# Patient Record
Sex: Male | Born: 1990 | Race: Black or African American | Hispanic: No | Marital: Single | Smoking: Never smoker
Health system: Southern US, Community
[De-identification: ages and names within clinical notes are randomized; demographics above are authoritative.]

---
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
## 2017-03-15 ENCOUNTER — Encounter (HOSPITAL_COMMUNITY): Payer: Self-pay | Admitting: Emergency Medicine

## 2017-03-15 ENCOUNTER — Ambulatory Visit (HOSPITAL_COMMUNITY)
Admission: EM | Admit: 2017-03-15 | Discharge: 2017-03-15 | Disposition: A | Discharge status: Home / Self Care | Payer: Self-pay | Attending: Emergency Medicine | Admitting: Emergency Medicine

## 2017-03-15 ENCOUNTER — Other Ambulatory Visit: Payer: Self-pay

## 2017-03-15 DIAGNOSIS — J069 Acute upper respiratory infection, unspecified: Secondary | ICD-10-CM

## 2017-03-15 MED ORDER — PSEUDOEPH-BROMPHEN-DM 30-2-10 MG/5ML PO SYRP: 5.0000 mL | ORAL_SOLUTION | Freq: Four times a day (QID) | ORAL | 0 refills | Status: AC | PRN

## 2017-03-15 NOTE — Discharge Instructions (Signed)
Please take medications as prescribed.  Alternate Tylenol and ibuprofen as needed for chills and body aches.  Please make sure you are drinking lots of fluids and return to the emergency department for any shortness of breath, fevers 1-2.1, worsening symptoms or any changes in health.

## 2017-03-15 NOTE — ED Provider Notes (Signed)
[facility]    CSN: [id number] Arrival date & time: 03/15/17  1539     History   Chief Complaint Chief Complaint  Patient presents with  . URI    HPI Julian Wallace is a 27 y.o. male presents to the urgent care facility for evaluation of sore throat, cough, chills.  Symptoms began 3 days ago.  Girlfriend recently recovered from cold symptoms.  He last took ibuprofen yesterday.  He has not been taking any other medications.  Patient denies any nausea, vomiting, diarrhea, fevers, abdominal pain.  He is tolerating p.o. well.  HPI  History reviewed. No pertinent past medical history.  There are no active problems to display for this patient.   History reviewed. No pertinent surgical history.     Home Medications    Prior to Admission medications   Medication Sig Start Date End Date Taking? Authorizing Provider  brompheniramine-pseudoephedrine-DM 30-2-10 MG/5ML syrup Take 5 mLs by mouth 4 (four) times daily as needed. 03/15/17   Evon SlackGaines, Thomas C, PA-C    Family History Family History  Problem Relation Age of Onset  . Diabetes Mother   . Hypertension Mother     Social History Social History   Tobacco Use  . Smoking status: Never Smoker  Substance Use Topics  . Alcohol use: Yes  . Drug use: No     Allergies   Patient has no known allergies.   Review of Systems Review of Systems  Constitutional: Negative for fever.  HENT: Positive for congestion, rhinorrhea and sore throat. Negative for sinus pressure and sinus pain.   Respiratory: Positive for cough. Negative for wheezing and stridor.   Gastrointestinal: Negative for diarrhea, nausea and vomiting.  Musculoskeletal: Positive for myalgias. Negative for arthralgias and neck stiffness.  Skin: Negative for rash.  Neurological: Negative for dizziness.     Physical Exam Triage Vital Signs ED Triage Vitals  Enc Vitals Group     BP 03/15/17 1803 124/65     Pulse Rate 03/15/17 1803 72     Resp  03/15/17 1803 18     Temp 03/15/17 1803 98.1 F (36.7 C)     Temp Source 03/15/17 1803 Oral     SpO2 03/15/17 1803 100 %     Weight --      Height --      Head Circumference --      Peak Flow --      Pain Score 03/15/17 1800 4     Pain Loc --      Pain Edu? --      Excl. in GC? --    No data found.  Updated Vital Signs BP 124/65 (BP Location: Right Arm)   Pulse 72   Temp 98.1 F (36.7 C) (Oral)   Resp 18   SpO2 100%   Visual Acuity Right Eye Distance:   Left Eye Distance:   Bilateral Distance:    Right Eye Near:   Left Eye Near:    Bilateral Near:     Physical Exam  Constitutional: He is oriented to person, place, and time. He appears well-developed and well-nourished. No distress.  HENT:  Head: Normocephalic and atraumatic.  Right Ear: Hearing, tympanic membrane, external ear and ear canal normal.  Left Ear: Hearing, tympanic membrane, external ear and ear canal normal.  Nose: Rhinorrhea present. No nasal septal hematoma. Right sinus exhibits no maxillary sinus tenderness and no frontal sinus tenderness. Left sinus exhibits no maxillary sinus tenderness and no frontal  sinus tenderness.  Mouth/Throat: No trismus in the jaw. No uvula swelling. No oropharyngeal exudate, posterior oropharyngeal edema, posterior oropharyngeal erythema or tonsillar abscesses.  Eyes: Conjunctivae are normal.  Neck: Normal range of motion.  Cardiovascular: Normal rate and regular rhythm.  Pulmonary/Chest: No stridor. No respiratory distress. He has no wheezes. He exhibits no tenderness.  Abdominal: Soft. He exhibits no distension. There is no tenderness. There is no guarding.  Musculoskeletal: Normal range of motion.  Neurological: He is alert and oriented to person, place, and time.  Skin: Skin is warm and dry. No rash noted.  Psychiatric: He has a normal mood and affect. His behavior is normal. Judgment and thought content normal.     UC Treatments / Results  Labs (all labs ordered  are listed, but only abnormal results are displayed) Labs Reviewed - No data to display  EKG  EKG Interpretation None       Radiology No results found.  Procedures Procedures (including critical care time)  Medications Ordered in UC Medications - No data to display   Initial Impression / Assessment and Plan / UC Course  I have reviewed the triage vital signs and the nursing notes.  Pertinent labs & imaging results that were available during my care of the patient were reviewed by me and considered in my medical decision making (see chart for details).     27 year old male with upper respiratory infection.  Vital signs are stable.  Patient tolerating p.o. well.  Patient will alternate Tylenol and ibuprofen.  He is given a prescription for Bromfed to help treat symptoms.  He is educated on signs and symptoms return to clinic for. Final Clinical Impressions(s) / UC Diagnoses   Final diagnoses:  Viral upper respiratory tract infection    ED Discharge Orders        Ordered    brompheniramine-pseudoephedrine-DM 30-2-10 MG/5ML syrup  4 times daily PRN     03/15/17 1809        Evon Slack, PA-C 03/15/17 1824

## 2017-03-15 NOTE — ED Triage Notes (Signed)
Onset of symptoms was Friday, woke Saturday with sore throat.  Patient reports runny nose, sore throat, cough, hot, sweaty episodes.

## 2020-06-23 ENCOUNTER — Emergency Department (HOSPITAL_COMMUNITY)
Admission: EM | Admit: 2020-06-23 | Discharge: 2020-06-24 | Disposition: A | Discharge status: Home / Self Care | Payer: 59 | Attending: Emergency Medicine | Admitting: Emergency Medicine

## 2020-06-23 ENCOUNTER — Other Ambulatory Visit: Payer: Self-pay

## 2020-06-23 DIAGNOSIS — Z5321 Procedure and treatment not carried out due to patient leaving prior to being seen by health care provider: Secondary | ICD-10-CM

## 2020-06-23 DIAGNOSIS — R202 Paresthesia of skin: Secondary | ICD-10-CM | POA: Insufficient documentation

## 2020-06-23 LAB — CBC WITH DIFFERENTIAL/PLATELET
Abs Immature Granulocytes: 0.01 10*3/uL (ref 0.00–0.07)
Basophils Absolute: 0 10*3/uL (ref 0.0–0.1)
Basophils Relative: 0 %
Eosinophils Absolute: 0.3 10*3/uL (ref 0.0–0.5)
Eosinophils Relative: 4 %
HCT: 42.8 % (ref 39.0–52.0)
Hemoglobin: 13.5 g/dL (ref 13.0–17.0)
Immature Granulocytes: 0 %
Lymphocytes Relative: 43 %
Lymphs Abs: 2.9 10*3/uL (ref 0.7–4.0)
MCH: 28.9 pg (ref 26.0–34.0)
MCHC: 31.5 g/dL (ref 30.0–36.0)
MCV: 91.6 fL (ref 80.0–100.0)
Monocytes Absolute: 0.6 10*3/uL (ref 0.1–1.0)
Monocytes Relative: 9 %
Neutro Abs: 3 10*3/uL (ref 1.7–7.7)
Neutrophils Relative %: 44 %
Platelets: 225 10*3/uL (ref 150–400)
RBC: 4.67 MIL/uL (ref 4.22–5.81)
RDW: 12.8 % (ref 11.5–15.5)
WBC: 6.8 10*3/uL (ref 4.0–10.5)
nRBC: 0 % (ref 0.0–0.2)

## 2020-06-23 LAB — COMPREHENSIVE METABOLIC PANEL
ALT: 14 U/L (ref 0–44)
AST: 22 U/L (ref 15–41)
Albumin: 4.3 g/dL (ref 3.5–5.0)
Alkaline Phosphatase: 64 U/L (ref 38–126)
Anion gap: 5 (ref 5–15)
BUN: 12 mg/dL (ref 6–20)
CO2: 30 mmol/L (ref 22–32)
Calcium: 9.3 mg/dL (ref 8.9–10.3)
Chloride: 103 mmol/L (ref 98–111)
Creatinine, Ser: 1.12 mg/dL (ref 0.61–1.24)
GFR, Estimated: >60 mL/min (ref >60)
Glucose, Bld: 108 mg/dL — ABNORMAL HIGH (ref 70–99)
Potassium: 3.5 mmol/L (ref 3.5–5.1)
Sodium: 138 mmol/L (ref 135–145)
Total Bilirubin: 0.9 mg/dL (ref 0.3–1.2)
Total Protein: 7.7 g/dL (ref 6.5–8.1)

## 2020-06-23 LAB — PHOSPHORUS: Phosphorus: 3.7 mg/dL (ref 2.5–4.6)

## 2020-06-23 LAB — MAGNESIUM: Magnesium: 2 mg/dL (ref 1.7–2.4)

## 2020-06-23 NOTE — ED Triage Notes (Signed)
Pt c/o L sided foot numbness x 4 days that has progressed to R foot and then R leg. States it now feels numb/tingling on whole R side. Denies visual changes or weakness

## 2020-06-23 NOTE — ED Provider Notes (Signed)
Emergency Medicine Provider Triage Evaluation Note  Julian Wallace , a 30 y.o. male  was evaluated in triage.  Pt complains of numbness.  Review of Systems  Positive: Tingling sensation, numbness Negative: Fever, headache, diplopia, pain, injury, rash  Physical Exam  BP (!) 157/94 (BP Location: Right Arm)   Pulse 85   Temp 98.9 F (37.2 C) (Oral)   Resp 18   Ht 6\' 1"  (1.854 m)   Wt 77.1 kg   SpO2 95%   BMI 22.43 kg/m  Gen:   Awake, no distress   Resp:  Normal effort  MSK:   Moves extremities without difficulty  Other:  Mild decreased sensation to R side of body compare to left.  Normal gait, normal patella DTR bilaterally.  No footdrops  Medical Decision Making  Medically screening exam initiated at 9:22 PM.  Appropriate orders placed.  Gasser was informed that the remainder of the evaluation will be completed by another provider, this initial triage assessment does not replace that evaluation, and the importance of remaining in the ED until their evaluation is complete.  Pt report tingling and numbness sensation to L foot 3-4 days ago, and for the past 2-3 days he experienced numbness involving R side of body from chest down to R foot, with tingling sensation on L foot.  No hx of DM, no recent viral infection, no difficulty breathing.  Does do heavy lifting at work.     Isaac Laud, PA-C 06/23/20 2124    2125, MD 06/25/20 [phone]

## 2020-06-24 ENCOUNTER — Other Ambulatory Visit: Payer: Self-pay

## 2020-06-24 ENCOUNTER — Encounter (HOSPITAL_COMMUNITY): Payer: Self-pay

## 2020-06-24 ENCOUNTER — Emergency Department (HOSPITAL_COMMUNITY)
Admission: EM | Admit: 2020-06-24 | Discharge: 2020-06-24 | Disposition: A | Discharge status: Home / Self Care | Payer: 59 | Source: Home / Self Care | Attending: Emergency Medicine | Admitting: Emergency Medicine

## 2020-06-24 ENCOUNTER — Emergency Department (HOSPITAL_COMMUNITY): Payer: 59

## 2020-06-24 DIAGNOSIS — R200 Anesthesia of skin: Secondary | ICD-10-CM

## 2020-06-24 DIAGNOSIS — R202 Paresthesia of skin: Secondary | ICD-10-CM | POA: Insufficient documentation

## 2020-06-24 LAB — CBC WITH DIFFERENTIAL/PLATELET
Abs Immature Granulocytes: 0.02 10*3/uL (ref 0.00–0.07)
Basophils Absolute: 0 10*3/uL (ref 0.0–0.1)
Basophils Relative: 1 %
Eosinophils Absolute: 0.2 10*3/uL (ref 0.0–0.5)
Eosinophils Relative: 3 %
HCT: 45.5 % (ref 39.0–52.0)
Hemoglobin: 14.5 g/dL (ref 13.0–17.0)
Immature Granulocytes: 0 %
Lymphocytes Relative: 26 %
Lymphs Abs: 1.6 10*3/uL (ref 0.7–4.0)
MCH: 29.6 pg (ref 26.0–34.0)
MCHC: 31.9 g/dL (ref 30.0–36.0)
MCV: 92.9 fL (ref 80.0–100.0)
Monocytes Absolute: 0.5 10*3/uL (ref 0.1–1.0)
Monocytes Relative: 8 %
Neutro Abs: 3.9 10*3/uL (ref 1.7–7.7)
Neutrophils Relative %: 62 %
Platelets: 209 10*3/uL (ref 150–400)
RBC: 4.9 MIL/uL (ref 4.22–5.81)
RDW: 12.7 % (ref 11.5–15.5)
WBC: 6.2 10*3/uL (ref 4.0–10.5)
nRBC: 0 % (ref 0.0–0.2)

## 2020-06-24 LAB — COMPREHENSIVE METABOLIC PANEL
ALT: 15 U/L (ref 0–44)
AST: 19 U/L (ref 15–41)
Albumin: 4.6 g/dL (ref 3.5–5.0)
Alkaline Phosphatase: 63 U/L (ref 38–126)
Anion gap: 6 (ref 5–15)
BUN: 12 mg/dL (ref 6–20)
CO2: 29 mmol/L (ref 22–32)
Calcium: 9.8 mg/dL (ref 8.9–10.3)
Chloride: 104 mmol/L (ref 98–111)
Creatinine, Ser: 1.15 mg/dL (ref 0.61–1.24)
GFR, Estimated: >60 mL/min (ref >60)
Glucose, Bld: 84 mg/dL (ref 70–99)
Potassium: 4.1 mmol/L (ref 3.5–5.1)
Sodium: 139 mmol/L (ref 135–145)
Total Bilirubin: 1.3 mg/dL — ABNORMAL HIGH (ref 0.3–1.2)
Total Protein: 7.9 g/dL (ref 6.5–8.1)

## 2020-06-24 LAB — RAPID URINE DRUG SCREEN, HOSP PERFORMED
Amphetamines: NOT DETECTED
Barbiturates: NOT DETECTED
Benzodiazepines: NOT DETECTED
Cocaine: NOT DETECTED
Opiates: NOT DETECTED
Tetrahydrocannabinol: POSITIVE — AB

## 2020-06-24 NOTE — ED Triage Notes (Signed)
Patient c/o numbness and tingling x 5 day to the left foot. Patient also c/o right foot and right legs pain x 4 days. Patient now says the right torso is numb and that began yesterday.  Patient denies any visual changes or weakness. Patient was seen yesterday for the same and left prior to knowing the results of labs, etc.

## 2020-06-24 NOTE — ED Provider Notes (Signed)
[facility]-EMERGENCY DEPT Provider Note   CSN: [id number] Arrival date & time: 06/24/20  1435     History No chief complaint on file.   Julian Wallace is a 30 y.o. male.  HPI 30 year old male with no significant medical history presents to the ER with complaints of numbness and tingling.  Patient states that initially he started having numbness and tingling to his left leg about a week ago, and this is gradually progressed over to his right, going up his right thigh.  Denies any weakness in his extremities he was seen last night in the ER but he left without being seen with similar complaint.  States today he started having right-sided rib numbness as well.  He denies loss of bowel bladder control.  No fevers or chills.  No recent viral illnesses.  No history of IV drug use.  He denies any foot drop or difficulty walking.  No dizziness.  No blurred vision.  No facial droop or word slurring.  He has not taken anything for his symptoms.  He reports history of chronic back pain, but no recent falls or injuries.    History reviewed. No pertinent past medical history.  There are no problems to display for this patient.   History reviewed. No pertinent surgical history.     Family History  Problem Relation Age of Onset  . Diabetes Mother   . Hypertension Mother     Social History   Tobacco Use  . Smoking status: Never Smoker  . Smokeless tobacco: Never Used  Vaping Use  . Vaping Use: Never used  Substance Use Topics  . Alcohol use: Yes  . Drug use: No    Home Medications Prior to Admission medications   Medication Sig Start Date End Date Taking? Authorizing Provider  brompheniramine-pseudoephedrine-DM 30-2-10 MG/5ML syrup Take 5 mLs by mouth 4 (four) times daily as needed. 03/15/17   Evon Slack, PA-C    Allergies    Patient has no known allergies.  Review of Systems   Review of Systems  Constitutional: Negative for chills and fever.  HENT:  Negative for ear pain and sore throat.   Eyes: Negative for pain and visual disturbance.  Respiratory: Negative for cough and shortness of breath.   Cardiovascular: Negative for chest pain and palpitations.  Gastrointestinal: Negative for abdominal pain and vomiting.  Genitourinary: Negative for dysuria and hematuria.  Musculoskeletal: Negative for arthralgias and back pain.  Skin: Negative for color change and rash.  Neurological: Positive for numbness. Negative for dizziness, seizures, syncope, facial asymmetry and weakness.  All other systems reviewed and are negative.   Physical Exam Updated Vital Signs BP 128/86   Pulse 72   Temp 98.6 F (37 C) (Oral)   Resp 18   Ht 6\' 1"  (1.854 m)   Wt 77.1 kg   SpO2 98%   BMI 22.43 kg/m   Physical Exam Vitals and nursing note reviewed.  Constitutional:      General: He is not in acute distress.    Appearance: He is well-developed. He is not ill-appearing, toxic-appearing or diaphoretic.  HENT:     Head: Normocephalic and atraumatic.  Eyes:     Conjunctiva/sclera: Conjunctivae normal.  Cardiovascular:     Rate and Rhythm: Normal rate and regular rhythm.     Heart sounds: No murmur heard.   Pulmonary:     Effort: Pulmonary effort is normal. No respiratory distress.     Breath sounds: Normal breath sounds.  Abdominal:     Palpations: Abdomen is soft.     Tenderness: There is no abdominal tenderness.  Musculoskeletal:        General: No tenderness. Normal range of motion.     Cervical back: Neck supple.     Right lower leg: No edema.     Left lower leg: No edema.     Comments: No C, T, L-spine tenderness.  5/5 strength in upper and lower extremities.  No noticeable step-offs, crepitus, fluctuance, erythema.  Sensations intact.  Full range of motion and strength of neck. Moving all 4 extremities without difficulty.     Skin:    General: Skin is warm and dry.     Findings: No erythema.  Neurological:     General: No focal  deficit present.     Mental Status: He is alert and oriented to person, place, and time.     Sensory: No sensory deficit.     Motor: No weakness.     Comments: 5/5 strength in upper and lower extremities bilaterally.  Full range of motion of all 4 extremities.  Patient does respond to light touch as well as pinching in all 4 extremities and chest wall.  2+ patellar and Achilles reflexes, as well as 2+ brachioradialis and biceps reflex.Mental Status:  Alert, thought content appropriate, able to give a coherent history. Speech fluent without evidence of aphasia. Able to follow 2 step commands without difficulty.  Cranial Nerves:  II: Peripheral visual fields grossly normal, pupils equal, round, reactive to light III,IV, VI: ptosis not present, extra-ocular motions intact bilaterally  V,VII: smile symmetric, facial light touch sensation equal VIII: hearing grossly normal to voice  X: uvula elevates symmetrically  XI: bilateral shoulder shrug symmetric and strong XII: midline tongue extension without fassiculations Motor:  Normal tone. 5/5 strength of BUE and BLE major muscle groups including strong and equal grip strength and dorsiflexion/plantar flexion Sensory: light touch normal in all extremities. Cerebellar: normal finger-to-nose with bilateral upper extremities, Romberg sign absent Gait: normal gait and balance. Able to walk on toes and heels with ease.  No foot drop.   Psychiatric:        Mood and Affect: Mood normal.        Behavior: Behavior normal.     ED Results / Procedures / Treatments   Labs (all labs ordered are listed, but only abnormal results are displayed) Labs Reviewed  COMPREHENSIVE METABOLIC PANEL - Abnormal; Notable for the following components:      Result Value   Total Bilirubin 1.3 (*)    All other components within normal limits  CBC WITH DIFFERENTIAL/PLATELET    EKG None  Radiology CT Head Wo Contrast  Result Date: 06/24/2020 CLINICAL DATA:   Bilateral leg tingling and numbness x5 days. EXAM: CT HEAD WITHOUT CONTRAST TECHNIQUE: Contiguous axial images were obtained from the base of the skull through the vertex without intravenous contrast. COMPARISON:  None. FINDINGS: Brain: No evidence of acute infarction, hemorrhage, hydrocephalus, extra-axial collection or mass lesion/mass effect. Vascular: No hyperdense vessel or unexpected calcification. Skull: Normal. Negative for fracture or focal lesion. Sinuses/Orbits: Mild left ethmoid sinus and right-sided frontal sinus mucosal thickening is noted. Other: A subcentimeter right frontal scalp soft tissue calcification is seen. IMPRESSION: No acute intracranial abnormality. Electronically Signed   By: Aram Candela M.D.   On: 06/24/2020 16:38    Procedures Procedures   Medications Ordered in ED Medications - No data to display  ED Course  I have  reviewed the triage vital signs and the nursing notes.  Pertinent labs & imaging results that were available during my care of the patient were reviewed by me and considered in my medical decision making (see chart for details).    MDM Rules/Calculators/A&P                          30 year old male who presents to the ER with complaints of "numbness" to his right and left lower extremities, which is now progressed to the right side of his rib cage.  On arrival, he is well-appearing, no acute distress, resting comfortably in the ER bed.  In-depth physical exam overall very reassuring, he has no weakness in upper or lower extremities bilaterally, his reflexes are intact, he does endorse sensation light touch and pinching in his upper and lower extremities bilaterally.  He ambulated with in front of a with no evidence of foot drop.  He has no midline tenderness to the C, T, L-spine.  He has no focal neurologic deficits including facial droop, word slurring, cranial nerves are intact  Labs and imaging ordered, reviewed and interpreted by me.  CBC and  CMP unremarkable, no evidence of electrode abnormalities, no white count.  CT of the head reassuring.  Unclear source of the patient's symptoms, however my suspicion for Guillain-Barr is low at this time given reassuring neurologic exam.  No evidence of cauda equina.  Low suspicion fo spinal abscess given no fever, no risk factors, no white count.  No evidence of stroke.  Suspicion for MS this time.  Will refer to neurology.  Patient was informed of the reassuring findings.  We discussed neurology follow-up, he voiced understanding and is agreeable.  We discussed return precautions.  Stable for discharge  Case discussed with Dr. Silverio Lay who is agreeable to the above plan and disposition Final Clinical Impression(s) / ED Diagnoses Final diagnoses:  Numbness    Rx / DC Orders ED Discharge Orders    None       Leone Brand 06/24/20 1813    Charlynne Pander, MD 06/24/20 2330

## 2020-06-24 NOTE — ED Notes (Signed)
An After Visit Summary was printed and given to the patient. Discharge instructions given and no further questions at this time.  

## 2020-06-24 NOTE — ED Provider Notes (Signed)
Went to evaluate patient and has apparently eloped without full evaluation.    Marily Memos, MD 06/24/20 0157

## 2020-06-24 NOTE — Discharge Instructions (Signed)
Your work-up today was overall reassuring.  Our suspicion for any acute life-threatening cause of your symptoms is low at this time.  Your scan of your head and your lab work did not show any acute abnormalities.  Please make sure to follow-up with neurology for further evaluation of your symptoms.  Please return to the ER for any new or worsening symptoms peer

## 2020-06-24 NOTE — ED Provider Notes (Incomplete)
  [facility]-EMERGENCY DEPT Provider Note   CSN: [id number] Arrival date & time: 06/24/20  1435     History No chief complaint on file.   Julian Wallace is a 30 y.o. male.  HPI     History reviewed. No pertinent past medical history.  There are no problems to display for this patient.   History reviewed. No pertinent surgical history.     Family History  Problem Relation Age of Onset  . Diabetes Mother   . Hypertension Mother     Social History   Tobacco Use  . Smoking status: Never Smoker  . Smokeless tobacco: Never Used  Vaping Use  . Vaping Use: Never used  Substance Use Topics  . Alcohol use: Yes  . Drug use: No    Home Medications Prior to Admission medications   Medication Sig Start Date End Date Taking? Authorizing Provider  brompheniramine-pseudoephedrine-DM 30-2-10 MG/5ML syrup Take 5 mLs by mouth 4 (four) times daily as needed. 03/15/17   Evon Slack, PA-C    Allergies    Patient has no known allergies.  Review of Systems   Review of Systems  Physical Exam Updated Vital Signs BP 118/77 (BP Location: Left Arm)   Pulse 68   Temp 98.6 F (37 C) (Oral)   Resp 16   Ht 6\' 1"  (1.854 m)   Wt 77.1 kg   SpO2 97%   BMI 22.43 kg/m   Physical Exam  ED Results / Procedures / Treatments   Labs (all labs ordered are listed, but only abnormal results are displayed) Labs Reviewed  CBC WITH DIFFERENTIAL/PLATELET  COMPREHENSIVE METABOLIC PANEL    EKG None  Radiology No results found.  Procedures Procedures {Remember to document critical care time when appropriate:1}  Medications Ordered in ED Medications - No data to display  ED Course  I have reviewed the triage vital signs and the nursing notes.  Pertinent labs & imaging results that were available during my care of the patient were reviewed by me and considered in my medical decision making (see chart for details).    MDM Rules/Calculators/A&P                           *** Final Clinical Impression(s) / ED Diagnoses Final diagnoses:  None    Rx / DC Orders ED Discharge Orders    None

## 2020-06-24 NOTE — ED Notes (Addendum)
Pt was not in designated hallway bed when staff returned to area. Pt not located in department. Pt did not inform staff he was leaving. ED provider notified.

## 2020-09-09 ENCOUNTER — Telehealth: Payer: Self-pay | Admitting: Diagnostic Neuroimaging

## 2020-09-09 NOTE — Telephone Encounter (Signed)
I LVM for patient to call us back. We will be closed on Friday and we need to reschedule him. Dr. Epimenio Foot has multiple times available on Thursday 8/4 that the patient can be put in. This this doesn't work for him referrals can find a new appointment for him.

## 2020-09-12 ENCOUNTER — Ambulatory Visit: Payer: 59 | Admitting: Diagnostic Neuroimaging

## 2022-07-19 IMAGING — CT CT HEAD W/O CM
4 series · 17 of 47 positions shown, 19 images · non-contrast
Comparison: None.

CLINICAL DATA: Bilateral leg tingling and numbness x5 days.

EXAM:
CT HEAD WITHOUT CONTRAST
TECHNIQUE: Contiguous axial images were obtained from the base of the skull
through the vertex without intravenous contrast.

[Series 2: head wo · axial · 0.43mm/px · z∈[-137,-17]mm · 7 of 32 slices shown, 9 images]
[im 4/32  brain]
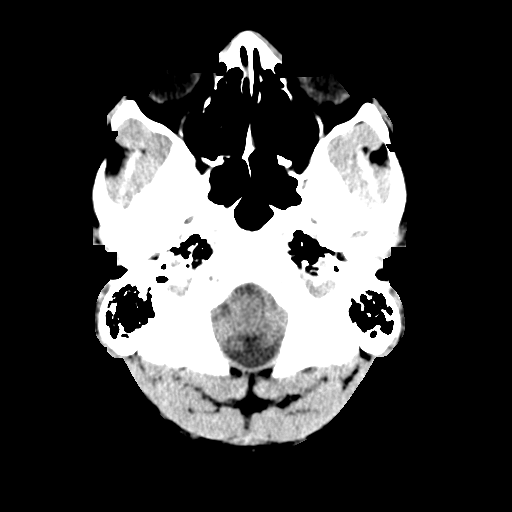
[im 4/32  bone]
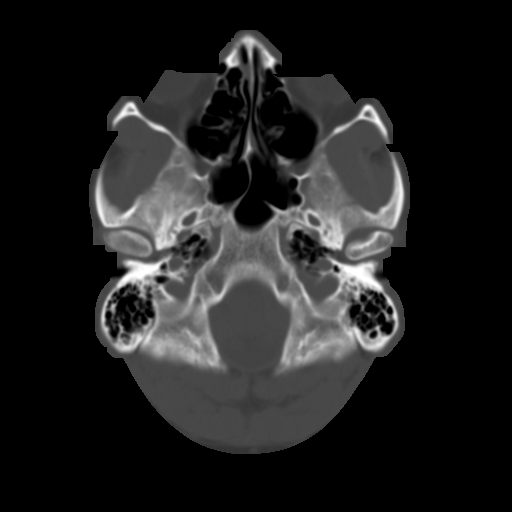
[im 8/32  brain]
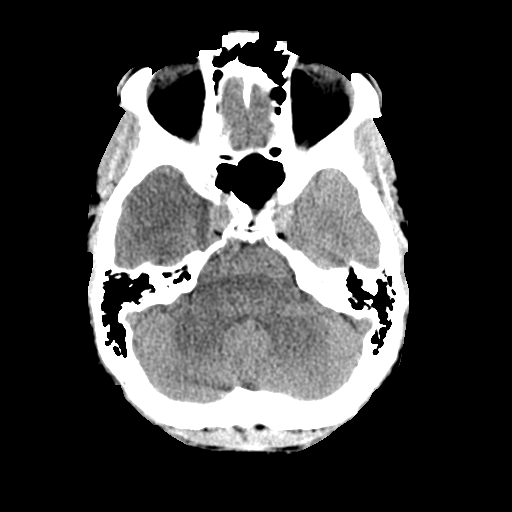
[im 12/32  brain]
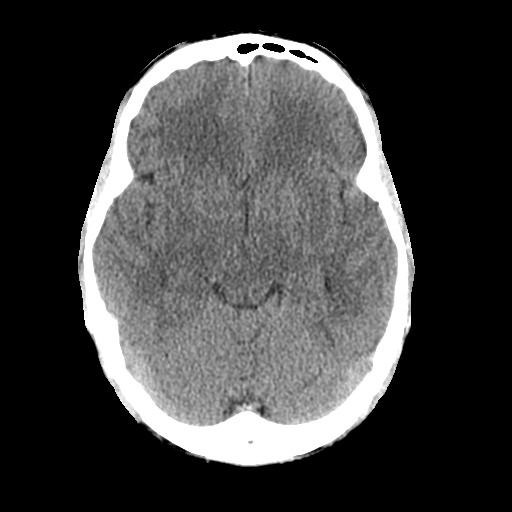
[im 16/32  brain]
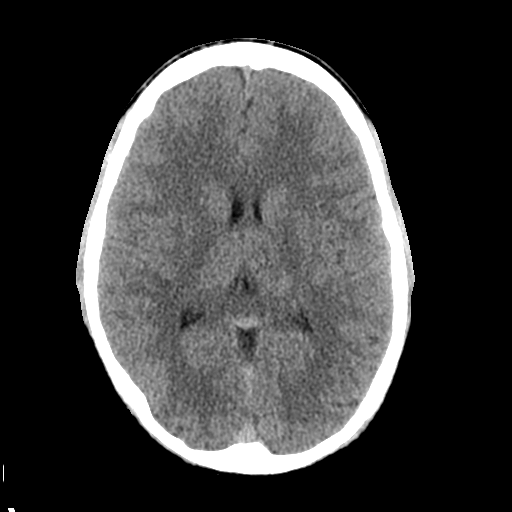
[im 20/32  brain]
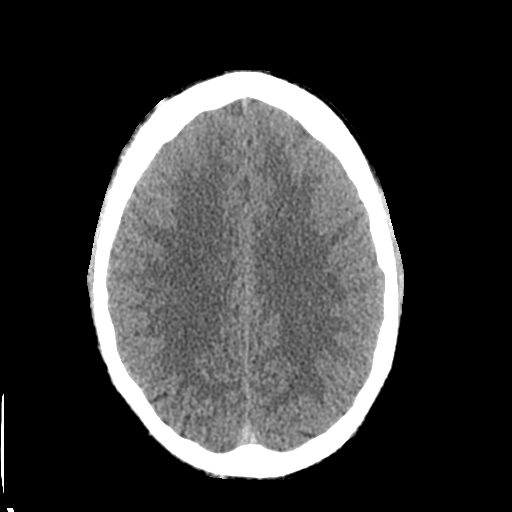
[im 20/32  bone]
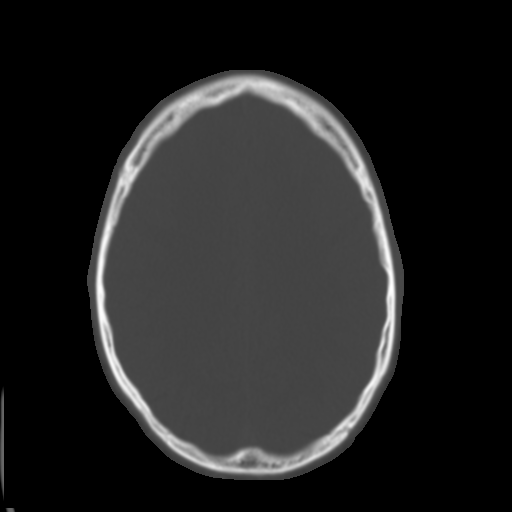
[im 24/32  brain]
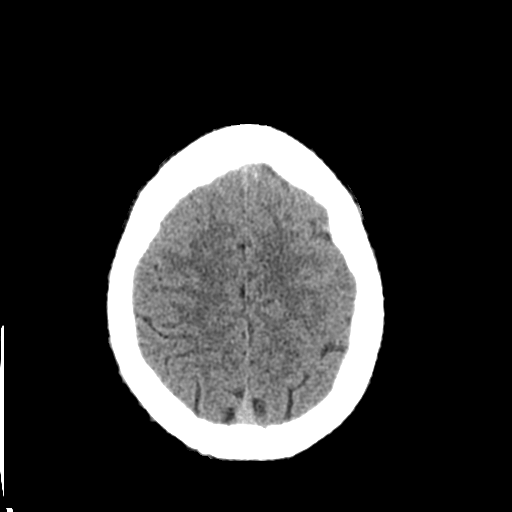
[im 28/32  brain]
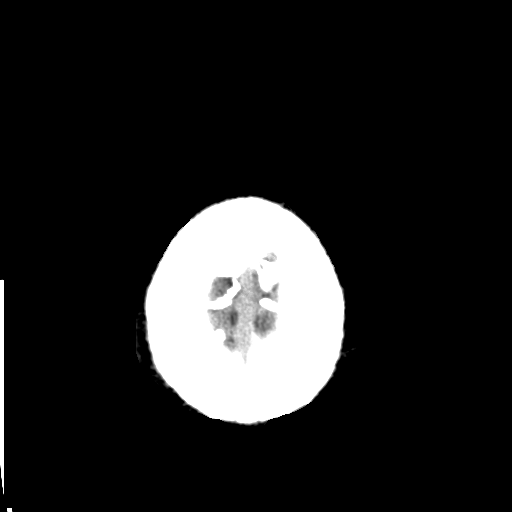

[Series 3: head bone · axial · 0.43mm/px · z∈[-138,-82]mm · 4 of 80 slices shown]
[im 8/80  bone]
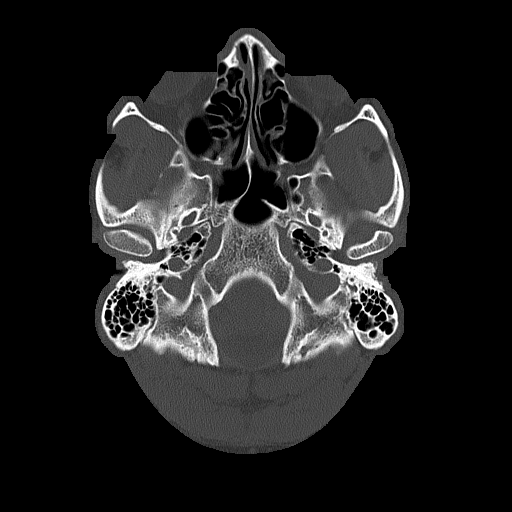
[im 16/80  bone]
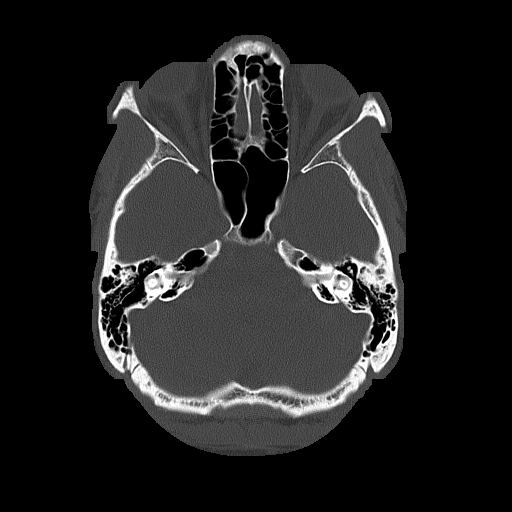
[im 24/80  bone]
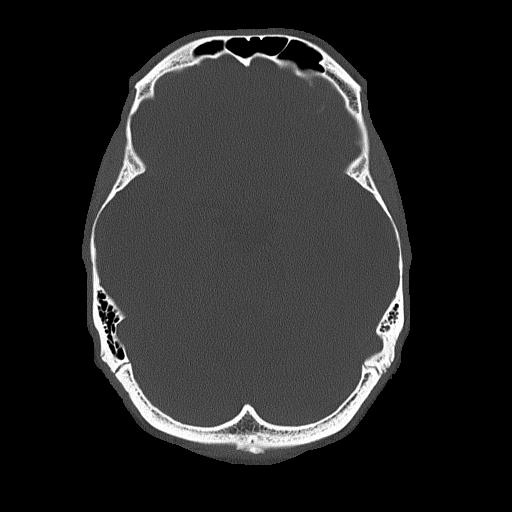
[im 36/80  bone]
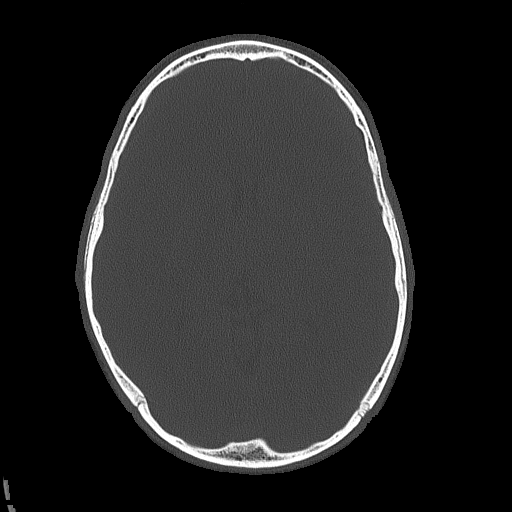

[Series 5: coronal soft tissue · coronal · 0.31mm/px · 3 of 69 slices shown]
[im 23/69  brain]
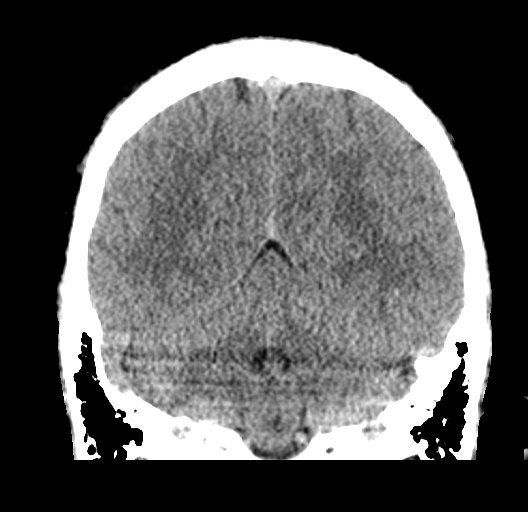
[im 31/69  brain]
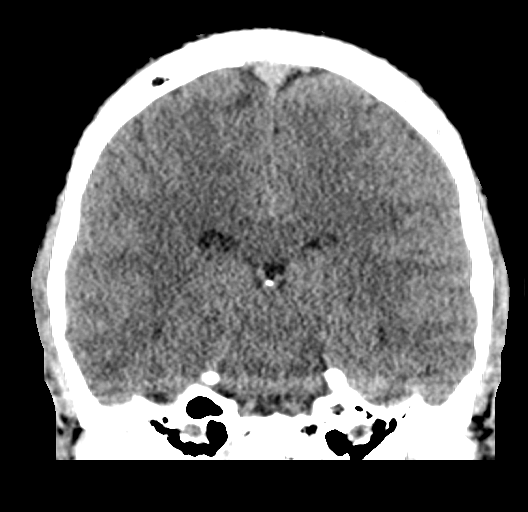
[im 38/69  brain]
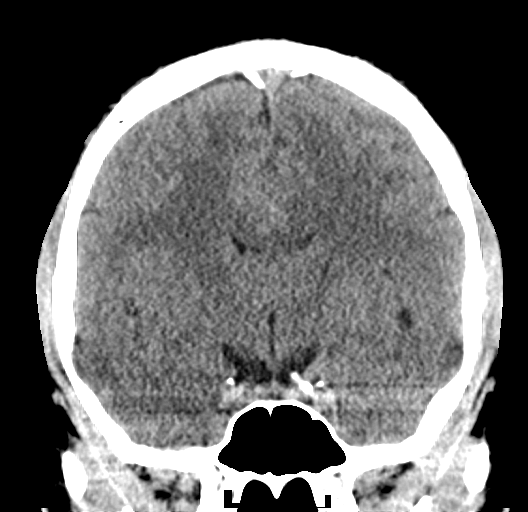

[Series 6: sagittal soft tissue · sagittal · 0.31mm/px · 3 of 56 slices shown]
[im 19/56  brain]
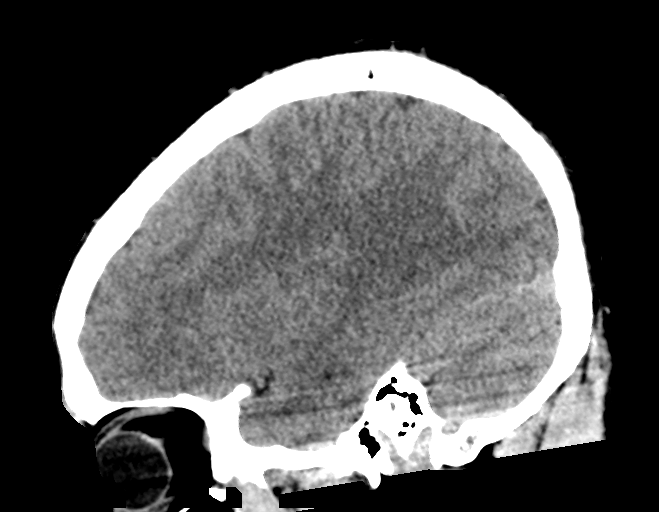
[im 28/56  brain]
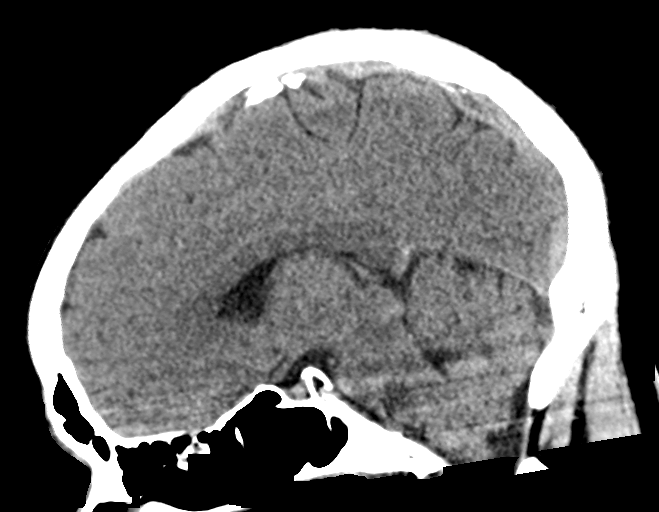
[im 37/56  brain]
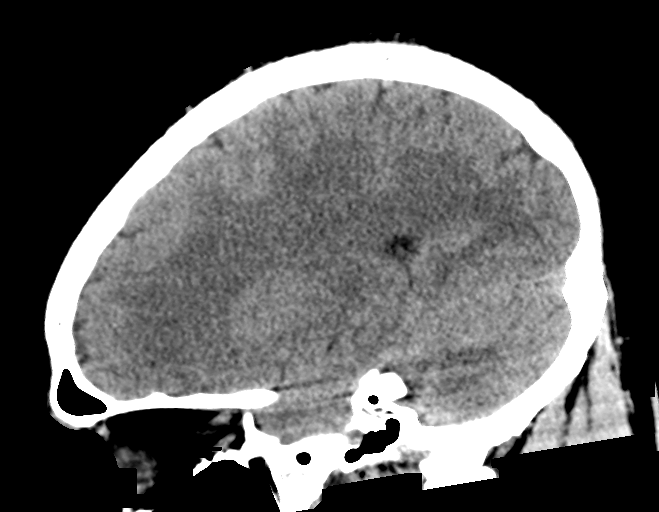

[17 of 47 positions shown; findings below may reference images not displayed]

FINDINGS: Brain: No evidence of acute infarction, hemorrhage, hydrocephalus,
extra-axial collection or mass lesion/mass effect.

Vascular: No hyperdense vessel or unexpected calcification.

Skull: Normal. Negative for fracture or focal lesion.

Sinuses/Orbits: Mild left ethmoid sinus and right-sided frontal
sinus mucosal thickening is noted.

Other: A subcentimeter right frontal scalp soft tissue calcification
is seen.
IMPRESSION: No acute intracranial abnormality.
